# Patient Record
Sex: Male | Born: 1990 | Race: White | Hispanic: No | Marital: Single | Smoking: Current every day smoker
Health system: Southern US, Community
[De-identification: ages and names within clinical notes are randomized; demographics above are authoritative.]

---

## 2015-07-17 ENCOUNTER — Encounter (HOSPITAL_COMMUNITY): Payer: Self-pay

## 2015-07-17 ENCOUNTER — Emergency Department (HOSPITAL_COMMUNITY): Payer: Self-pay

## 2015-07-17 ENCOUNTER — Emergency Department (HOSPITAL_COMMUNITY)
Admission: EM | Admit: 2015-07-17 | Discharge: 2015-07-17 | Disposition: A | Discharge status: Home / Self Care | Payer: Self-pay | Attending: Emergency Medicine | Admitting: Emergency Medicine

## 2015-07-17 DIAGNOSIS — Y998 Other external cause status: Secondary | ICD-10-CM | POA: Insufficient documentation

## 2015-07-17 DIAGNOSIS — Y9289 Other specified places as the place of occurrence of the external cause: Secondary | ICD-10-CM | POA: Insufficient documentation

## 2015-07-17 DIAGNOSIS — F17210 Nicotine dependence, cigarettes, uncomplicated: Secondary | ICD-10-CM | POA: Insufficient documentation

## 2015-07-17 DIAGNOSIS — S62522B Displaced fracture of distal phalanx of left thumb, initial encounter for open fracture: Secondary | ICD-10-CM | POA: Insufficient documentation

## 2015-07-17 DIAGNOSIS — Z880 Allergy status to penicillin: Secondary | ICD-10-CM | POA: Insufficient documentation

## 2015-07-17 DIAGNOSIS — W228XXA Striking against or struck by other objects, initial encounter: Secondary | ICD-10-CM | POA: Insufficient documentation

## 2015-07-17 DIAGNOSIS — Y9389 Activity, other specified: Secondary | ICD-10-CM | POA: Insufficient documentation

## 2015-07-17 DIAGNOSIS — S62502B Fracture of unspecified phalanx of left thumb, initial encounter for open fracture: Secondary | ICD-10-CM

## 2015-07-17 MED ORDER — CLINDAMYCIN PHOSPHATE 900 MG/50ML IV SOLN
900.0000 mg | Freq: Once | INTRAVENOUS | Status: AC
  Administered 2015-07-17: 900 mg via INTRAVENOUS
  Filled 2015-07-17: qty 50

## 2015-07-17 MED ORDER — LIDOCAINE HCL (PF) 1 % IJ SOLN
30.0000 mL | Freq: Once | INTRAMUSCULAR | Status: DC
  Filled 2015-07-17: qty 30

## 2015-07-17 MED ORDER — CEPHALEXIN 250 MG PO CAPS: 250.0000 mg | ORAL_CAPSULE | Freq: Four times a day (QID) | ORAL | Status: DC

## 2015-07-17 MED ORDER — OXYCODONE-ACETAMINOPHEN 5-325 MG PO TABS: 2.0000 | ORAL_TABLET | ORAL | Status: DC | PRN

## 2015-07-17 MED ORDER — BUPIVACAINE HCL (PF) 0.5 % IJ SOLN
20.0000 mL | Freq: Once | INTRAMUSCULAR | Status: DC
  Filled 2015-07-17: qty 20

## 2015-07-17 MED ORDER — FENTANYL CITRATE (PF) 100 MCG/2ML IJ SOLN
100.0000 ug | Freq: Once | INTRAMUSCULAR | Status: AC
  Administered 2015-07-17: 100 ug via INTRAVENOUS
  Filled 2015-07-17: qty 2

## 2015-07-17 MED ORDER — ONDANSETRON HCL 4 MG/2ML IJ SOLN
4.0000 mg | Freq: Once | INTRAMUSCULAR | Status: AC
  Administered 2015-07-17: 4 mg via INTRAVENOUS
  Filled 2015-07-17: qty 2

## 2015-07-17 NOTE — ED Notes (Signed)
Pt has crush injury to left thumb from working on a truck.  Pt was seen at [facility] and was given abx, pain medication and a referral to ortho.  Family reports that pt was unable to follow up with ortho due to lack of insurance.

## 2015-07-17 NOTE — Consult Note (Signed)
Reason for Consult: Crush injury to the left thumb Referring Physician: Anitra Lauth M.D.  Mike Pierce is an 25 y.o. male.  HPI: The patient is a 25 year old male who presents to [facility] emergency room for evaluation of his left thumb. He sustained a crush injury this past Sunday while using a sledgehammer trying to break free a universal joint. The patient is a Curator. Unfortunately, he sustained a significant injury to the left thumb and was seen and evaluated initially at [facility] emergency room. As noted to have an open fracture about the tip apparently the distal tip was in poor repair he underwent a preliminary I and D and closure and was told to follow-up with orthopedics sometime this week. He contacted [facility] emergency room once again stating an increased amount of pain and they recommended that he proceed to have this hospital for evaluation by hand surgery. The patient states he did not want to go to [facility] and thus he presents to [facility] emergency room today. We have discussed all issues with he and his mother who accompanies him today. Planes of significant amount of pain about the distal tip of the left thumb. He has been taking Keflex up into this point in time as well as oxycodone for pain. He denies fevers, chills, nausea, vomiting. He does have a penicillin allergy but has tolerated the Keflex without any difficulties. He denies any other injury.  History reviewed. No pertinent past medical history.  History reviewed. No pertinent past surgical history.  History reviewed. No pertinent family history.  Social History:  reports that he has been smoking Cigarettes.  He has been smoking about 1.00 pack per day. He does not have any smokeless tobacco history on file. He reports that he drinks about 25.2 oz of alcohol per week. He reports that he does not use illicit drugs.  Allergies:  Allergies  Allergen Reactions  . Penicillins Anaphylaxis  . Adult nurse   . Claritin  [Loratadine]   . Sulfa Antibiotics Hives    Medications: I have reviewed the patient's current medications.  No results found for this or any previous visit (from the past 48 hour(s)).  Dg Finger Thumb Left  07/17/2015  CLINICAL DATA:  Hit left thumb with sledgehammer 2 days ago. Worsening pain. EXAM: LEFT THUMB 2+V COMPARISON:  None. FINDINGS: Comminuted fracture noted through the distal aspect of the left thumb distal phalanx. Fracture fragments are significantly displaced and angulated. No visible extension into the IP joint. IMPRESSION: Comminuted, angulated fracture through the tip of the left thumb distal phalanx. Electronically Signed   By: Charlett Nose M.D.   On: 07/17/2015 14:18    Review of Systems  Constitutional: Negative.   HENT: Negative.   Eyes: Negative.   Respiratory: Negative.   Cardiovascular: Negative.   Gastrointestinal: Negative.   Genitourinary: Negative.   Musculoskeletal:       See history of present illness  Skin: Negative.   Neurological: Negative.   Endo/Heme/Allergies: Negative.    Blood pressure 133/77, pulse 94, temperature 98.1 F (36.7 C), temperature source Oral, resp. rate 18, height 5\' 8"  (1.727 m), weight 95.255 kg (210 lb), SpO2 95 %. Physical Exam  Examination of the left thumb: This is noted to have a significant crush injury with a large portion of an exposed portion of distal phalanx protruding through the nail bed about the ulnar aspect of the eponychial fold/lateral fold. He has marked deformity and significant soft tissue disarray about the distal tip. FPL and  EPL are intact. The patient is very uncomfortable in regards to the distal tip. He has no cellulitic process at this juncture. 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  Assessment/Plan: Left thumb crush injury with open fracture of the distal phalanx  comminuted in nature opposed since Sunday with significant soft tissue disarray and deformity. I have had a lengthy discussion with the patient as well as his mother in regards to his thumb. We have recommended proceeding with irrigation and excisional debridement and treatment of an open fracture. I have expressed to them are significant concerns for the potential of infection given the timeframe that has passed. Thus I discussed with him as a complication such as infection, loss of digit, cold  Intolerances, stiffness, etc. After obtaining verbal consent, the patient underwent: #1 copious irrigation and excisional debridement of an open fracture about the left thumb distal phalanx fracture comminuted #2 additional debridement of bony fragments about the left thumb distal phalanx #3 complex left thumb nailbed repair #4 treatment of a comminuted complex open fracture about the left thumb distal phalanx Procedure in detail:  Verbal consent was obtained. Risk and benefits were discussed at length with the patient. The hand was preliminarily prepped and a digital block utilizing 5 mL of Xylocaine without epinephrine as well as 5 mL of Sensorcaine without epinephrine was administered for digital block as well as the dorsal aspect of the thumb. He tolerated this very well and there was no complications he had excellent block noted. Following this he underwent copious irrigation of over 3 L of saline. Tourniquet was then applied to the thumb and the distal portion was cleaned gently. He was noted to have exposed distal phalanx and through the nailbed and associated complex nailbed laceration as well as laceration to the lateral folds both radial and ulnar in nature. The exposed portion of the distal phalanx was then meticulously dissected in order to leave the middle bed intact. It was removed without difficulties with the nailbed remaining. The wound were then copiously irrigated once again and excisional  debridement of bony fragments and the skin, subcutaneous tissue was performed. The wound was quite contaminated. Once the ocean of exposed bone was removed and the bony debris was removed, once again copious irrigation of 2 L of saline was directly placed into the depths of the wound. He tolerated this well and there were no complications. This the distal portion of the phalanx was sculpted to a smooth edge. Surrounding nailbed that was then sharply excised as it was very jagged in nature. The nailbed was repaired utilizing a series of 5.0 chromics in a simple fashion., The lateral folds were repaired utilizing 5.0, the tourniquet was then released excellent refill noted. Following this Adaptic was placed underneath the eponychial fold followed by additional Adaptic and Xeroform. Soft dressings as well as a thumb splint was applied. The patient tolerated the procedure well and there was no complications. We have discussed with him at length the need to be diligent when taking his antibiotics given at concerns for potential of infection. In addition he will take his pain medication accordingly. We have discussed with him the merits of tobacco cessation and have asked him not to drink alcohol when taking his pain medication. Recommended diligent elevation, edema control and range of motion to the remaining digits. He will need to follow-up  with [place] in our office in 10-12 days which juncture we will gently remove his dressings. Radiographs will be obtained and he'll be placed back into a thumb protector. We have discussed with him it will take approximately 2 months use the thumb in a meaningful fashion as he is a Curator. It'll take approximately 4-6 months for the nail plate to regrow. He will contact our office for any questions or concerns. He's been given an additional prescription for Keflex as he tolerates this well as well as oxycodone or pain. We've discussed with him vitamin C thousand milligrams as well as  an over-the-counter stool softener when taking pain medications. Josuha Fontanez L 07/17/2015, 5:15 PM

## 2015-07-17 NOTE — ED Provider Notes (Signed)
CSN: [id number]     Arrival date & time 07/17/15  4098 History   First MD Initiated Contact with Patient 07/17/15 [id number]     Chief Complaint  Patient presents with  . Extremity Pain     (Consider location/radiation/quality/duration/timing/severity/associated sxs/prior Treatment) HPI Comments: Patient who is right-handed presents with crush injury to the left thumb sustained 48 hours ago when he hit it with a sledgehammer. He was diagnosed with open comminuted fx of left thumb. Patient was seen at [facility] where he received stitches, had this thumbnail removed, started on pain medication and antibiotics. He was asked to follow-up with hand physician at [facility], however patient is unable to go to [place]. Tetanus up-to-date. Patient states that he is taking cephalexin as prescribed. Denies other injury. Last ate last night, pain medication 7am, and had 1 Dr. Reino Kent 20 minutes prior to arrival. Wound cleaned well several times prior to arrival.   Patient is a 25 y.o. male presenting with extremity pain. The history is provided by the patient.  Extremity Pain Associated symptoms include arthralgias and myalgias. Pertinent negatives include no joint swelling, neck pain, numbness or weakness.    History reviewed. No pertinent past medical history. History reviewed. No pertinent past surgical history. History reviewed. No pertinent family history. Social History  Substance Use Topics  . Smoking status: Current Every Day Smoker -- 1.00 packs/day    Types: Cigarettes  . Smokeless tobacco: None  . Alcohol Use: 25.2 oz/week    42 Cans of beer per week    Review of Systems  Constitutional: Negative for activity change.  Musculoskeletal: Positive for myalgias and arthralgias. Negative for back pain, joint swelling, gait problem and neck pain.  Skin: Positive for wound.  Neurological: Negative for weakness and numbness.      Allergies  Penicillins; Apricot flavor; Claritin; and  Sulfa antibiotics  Home Medications   Prior to Admission medications   Not on File   BP 153/83 mmHg  Pulse 107  Temp(Src) 98.1 F (36.7 C) (Oral)  Resp 18  Ht 5\' 8"  (1.727 m)  Wt 95.255 kg  BMI 31.94 kg/m2  SpO2 97%   Physical Exam  Constitutional: He appears well-developed and well-nourished.  HENT:  Head: Normocephalic and atraumatic.  Eyes: Conjunctivae are normal.  Neck: Normal range of motion. Neck supple.  Cardiovascular: Normal pulses.   Musculoskeletal: He exhibits edema and tenderness.  Patient with open wound to the dorsal aspect of the left thumb. Several stitches are in place. There is active oozing of blood. There is a deformity noted at the nail bed. No exposed bone visualized.  Neurological: He is alert. No sensory deficit.  Motor, sensation, and vascular distal to the injury is fully intact.   Skin: Skin is warm and dry.  Psychiatric: He has a normal mood and affect.  Nursing note and vitals reviewed.            ED Course  Procedures (including critical care time) Labs Review Labs Reviewed - No data to display  Imaging Review Dg Finger Thumb Left  07/17/2015  CLINICAL DATA:  Hit left thumb with sledgehammer 2 days ago. Worsening pain. EXAM: LEFT THUMB 2+V COMPARISON:  None. FINDINGS: Comminuted fracture noted through the distal aspect of the left thumb distal phalanx. Fracture fragments are significantly displaced and angulated. No visible extension into the IP joint. IMPRESSION: Comminuted, angulated fracture through the tip of the left thumb distal phalanx. Electronically Signed   By: Charlett Nose M.D.  On: 07/17/2015 14:18   I have personally reviewed and evaluated these images and lab results as part of my medical decision-making.   EKG Interpretation None       9:49 AM Patient seen and examined. Work-up initiated. Medications ordered.   Vital signs reviewed and are as follows: BP 153/83 mmHg  Pulse 107  Temp(Src) 98.1 F (36.7 C)  (Oral)  Resp 18  Ht 5\' 8"  (1.727 m)  Wt 95.255 kg  BMI 31.94 kg/m2  SpO2 97%  Spoke with Dr. Amanda PeaGramig who is in OR for the afternoon. Arlys JohnBrian PA-C to see.   4:06 PM Patient has been evaluated by hand. Repair underway at bedside by Karie ChimeraBrian Buchanan PA-C.   Dr. Anitra LauthPlunkett aware of patient. Anticipate d/c to home when completed. He has Percocet and Keflex at home.   MDM   Final diagnoses:  Open fracture of thumb, left, initial encounter   Hand has seen and performed wound revision in ED.    Renne CriglerJoshua Sherida Dobkins, PA-C 07/17/15 1616  Pricilla LovelessScott Goldston, MD 07/20/15 [phone]

## 2015-07-17 NOTE — Progress Notes (Signed)
Orthopedic Tech Progress Note Patient Details:  Mike Pierce 11/21/1990 [id number]  Ortho Devices Type of Ortho Device: Finger splint Ortho Device/Splint Location: lue Ortho Device/Splint Interventions: Application As ordered by Pa Rubye BeachBrian  Laurice Iglesia 07/17/2015, 4:30 PM

## 2015-07-17 NOTE — Discharge Instructions (Signed)
Please read and follow all provided instructions.  Your diagnoses today include:  1. Open fracture of thumb, left, initial encounter     Tests performed today include:  An x-ray of the affected area - shows multiple broken bone in thumb  Vital signs. See below for your results today.   Home care instructions:   Follow any educational materials contained in this packet  Follow R.I.C.E. Protocol:  R - rest your injury   I  - use ice on injury without applying directly to skin  C - compress injury with bandage or splint  E - elevate the injury as much as possible  Follow-up instructions: Please follow-up with Dr. Amanda Pea as instructed.    Return instructions:   Please return if your fingers are numb or tingling, appear gray or blue, or you have severe pain (also elevate the arm and loosen splint or wrap if you were given one)  Please return to the Emergency Department if you experience worsening symptoms.   Please return if you have any other emergent concerns.  Additional Information:  Your vital signs today were: BP 133/77 mmHg   Pulse 94   Temp(Src) 98.1 F (36.7 C) (Oral)   Resp 18   Ht 5\' 8"  (1.727 m)   Wt 95.255 kg   BMI 31.94 kg/m2   SpO2 95% If your blood pressure (BP) was elevated above 135/85 this visit, please have this repeated by your doctor within one month. --------------    Finger Fracture Fractures of fingers are breaks in the bones of the fingers. There are many types of fractures. There are different ways of treating these fractures. Your health care provider will discuss the best way to treat your fracture. CAUSES Traumatic injury is the main cause of broken fingers. These include:  Injuries while playing sports.  Workplace injuries.  Falls. RISK FACTORS Activities that can increase your risk of finger fractures include:  Sports.  Workplace activities that involve machinery.  A condition called osteoporosis, which can make your bones  less dense and cause them to fracture more easily. SIGNS AND SYMPTOMS The main symptoms of a broken finger are pain and swelling within 15 minutes after the injury. Other symptoms include:  Bruising of your finger.  Stiffness of your finger.  Numbness of your finger.  Exposed bones (compound fracture) if the fracture is severe. DIAGNOSIS  The best way to diagnose a broken bone is with X-ray imaging. Additionally, your health care provider will use this X-ray image to evaluate the position of the broken finger bones.  TREATMENT  Finger fractures can be treated with:   Nonreduction--This means the bones are in place. The finger is splinted without changing the positions of the bone pieces. The splint is usually left on for about a week to 10 days. This will depend on your fracture and what your health care provider thinks.  Closed reduction--The bones are put back into position without using surgery. The finger is then splinted.  Open reduction and internal fixation--The fracture site is opened. Then the bone pieces are fixed into place with pins or some type of hardware. This is seldom required. It depends on the severity of the fracture. HOME CARE INSTRUCTIONS   Follow your health care provider's instructions regarding activities, exercises, and physical therapy.  Only take over-the-counter or prescription medicines for pain, discomfort, or fever as directed by your health care provider. SEEK MEDICAL CARE IF: You have pain or swelling that limits the motion or use  of your fingers. SEEK IMMEDIATE MEDICAL CARE IF:  Your finger becomes numb. MAKE SURE YOU:   Understand these instructions.  Will watch your condition.  Will get help right away if you are not doing well or get worse.   This information is not intended to replace advice given to you by your health care provider. Make sure you discuss any questions you have with your health care provider.   Document Released:  10/12/2000 Document Revised: 04/20/2013 Document Reviewed: 02/09/2013 Elsevier Interactive Patient Education [employer]2016 [employer] Inc[employer]

## 2017-06-30 IMAGING — DX DG FINGER THUMB 2+V*L*
3 series · 3 of 3 positions shown · non-contrast
Comparison: None.

CLINICAL DATA: Hit left thumb with sledgehammer 2 days ago.
Worsening pain.

EXAM:
LEFT THUMB 2+V

[finger ap]
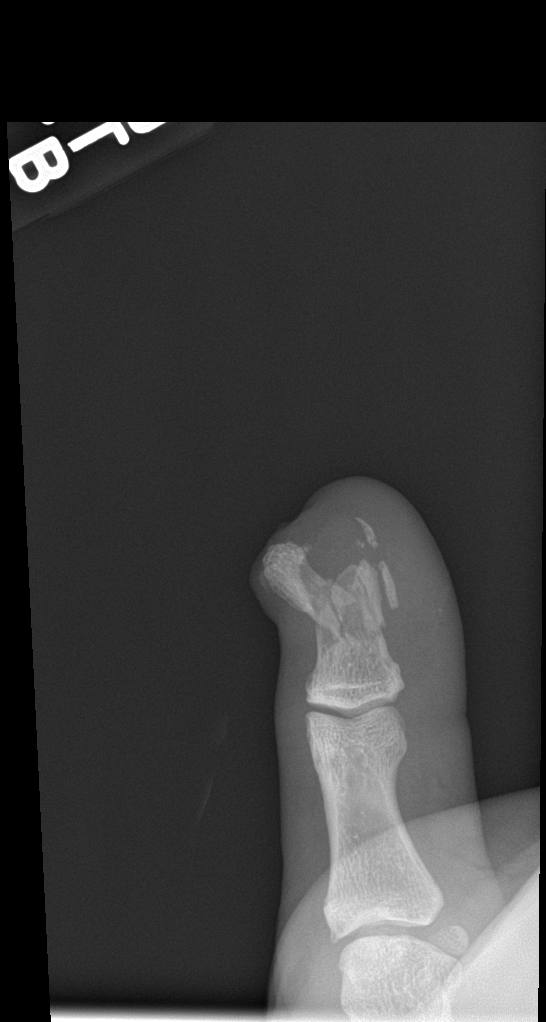

[finger obl]
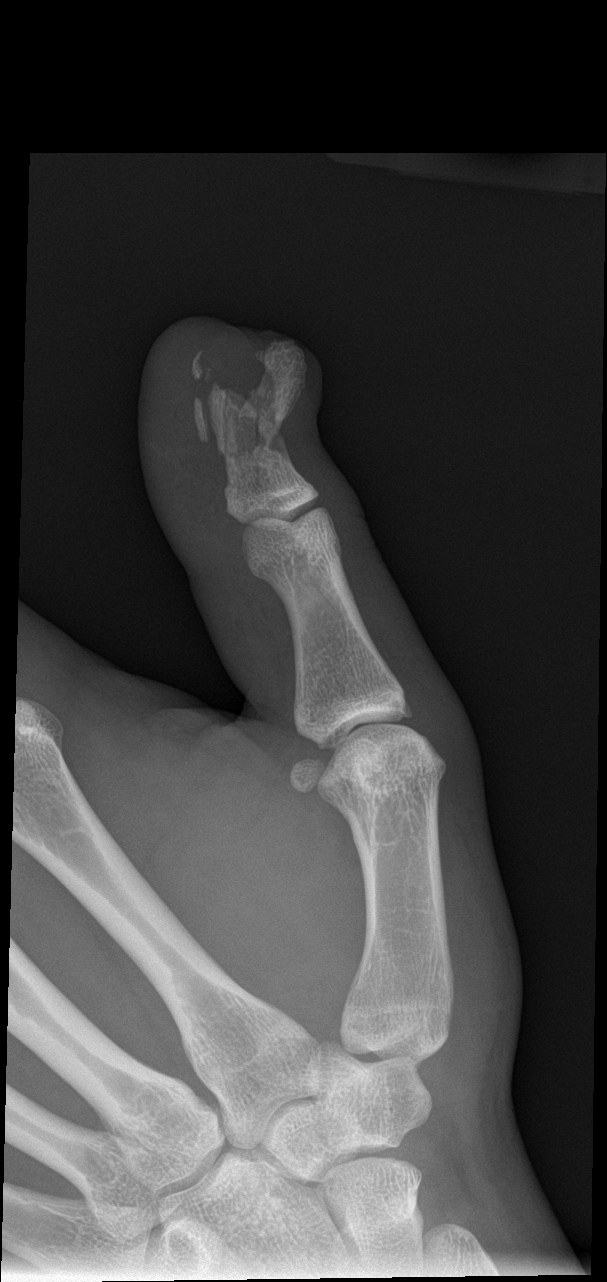

[finger lat]
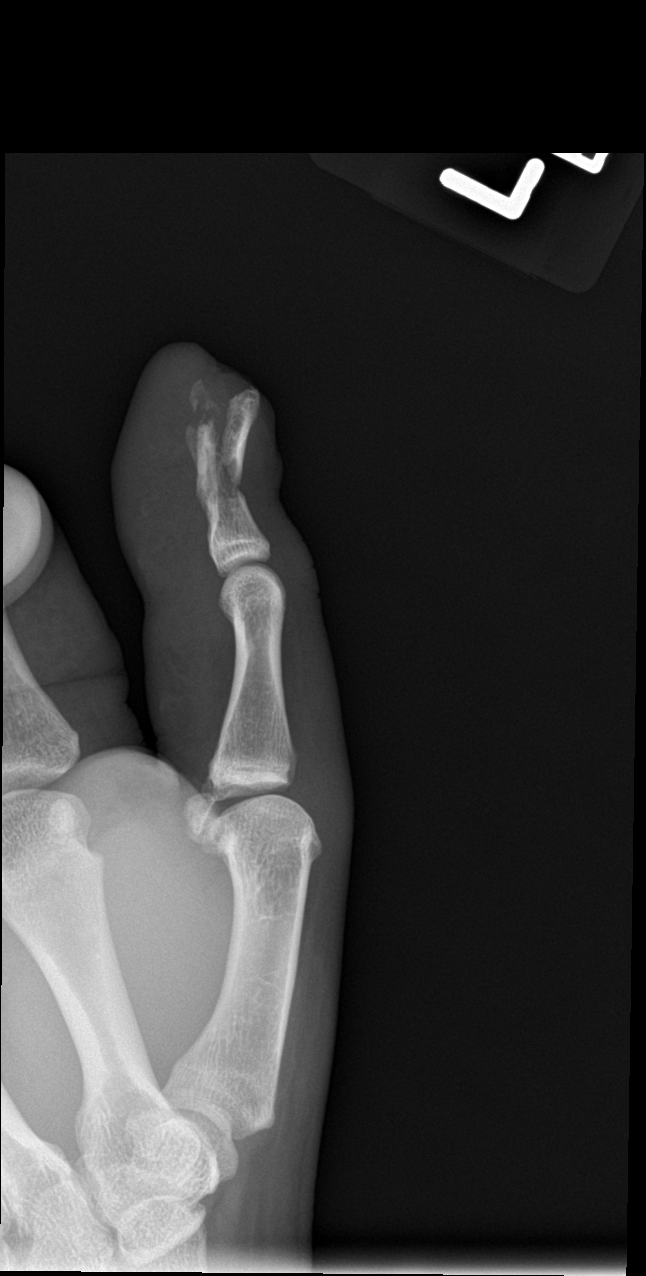

[3 of 3 positions shown; findings below may reference images not displayed]

FINDINGS: Comminuted fracture noted through the distal aspect of the left
thumb distal phalanx. Fracture fragments are significantly displaced
and angulated. No visible extension into the IP joint.
IMPRESSION: Comminuted, angulated fracture through the tip of the left thumb
distal phalanx.

## 2022-03-07 ENCOUNTER — Ambulatory Visit
Admission: EM | Admit: 2022-03-07 | Discharge: 2022-03-07 | Disposition: A | Discharge status: Home / Self Care | Payer: BC Managed Care – PPO | Attending: Physician Assistant | Admitting: Physician Assistant

## 2022-03-07 DIAGNOSIS — R000 Tachycardia, unspecified: Secondary | ICD-10-CM

## 2022-03-07 DIAGNOSIS — L089 Local infection of the skin and subcutaneous tissue, unspecified: Secondary | ICD-10-CM | POA: Diagnosis not present

## 2022-03-07 DIAGNOSIS — R21 Rash and other nonspecific skin eruption: Secondary | ICD-10-CM | POA: Diagnosis not present

## 2022-03-07 LAB — POCT URINALYSIS DIP (MANUAL ENTRY)
Bilirubin, UA: NEGATIVE
Blood, UA: NEGATIVE
Glucose, UA: NEGATIVE mg/dL
Ketones, POC UA: NEGATIVE mg/dL
Nitrite, UA: NEGATIVE
Protein Ur, POC: NEGATIVE mg/dL
Spec Grav, UA: 1.02 (ref 1.010–1.025)
Urobilinogen, UA: 0.2 E.U./dL
pH, UA: 7 (ref 5.0–8.0)

## 2022-03-07 MED ORDER — DOXYCYCLINE HYCLATE 100 MG PO CAPS: 100.0000 mg | ORAL_CAPSULE | Freq: Two times a day (BID) | ORAL | 0 refills | Status: DC

## 2022-03-07 NOTE — Discharge Instructions (Addendum)
Believe that your rash is allergic in nature.  Please continue Benadryl.  Use hypoallergenic soaps and detergents.  I recommend an over-the-counter emollient such as Aquaphor for moisturizing.  We are going to cover for secondary bacterial infection.  Take doxycycline 100 mg twice daily for 10 days.  Make sure to stay out of the sun while on this medication as it can cause you to have a very bad sunburn.  If your symptoms or not improving we need to reevaluate you.  If anything worsens return immediately.  Your heart rate remains slightly elevated.  It is important that you avoid caffeine and decongestants.  We will contact you with your lab work as soon as we have these results.  I do recommend that if you continue to have a high heart rate (above 100) you follow-up with cardiology.  To call to schedule an appointment.  Someone should reach out to you to schedule an appointment with a primary care provider.  If you develop any chest pain, shortness of breath, heart racing, lightheadedness you need to go to the emergency room.

## 2022-03-07 NOTE — ED Provider Notes (Signed)
[facility]    CSN: [id number] Arrival date & time: 03/07/22  1451      History   Chief Complaint Chief Complaint  Patient presents with   Rash    HPI Mike Pierce is a 31 y.o. male.   Patient presents today with a several month history of pruritic and painful rash on bilateral lower arms.  He denies any exposure to chemicals, plants, insects, animals.  Denies any medication changes.  Reports a few episodes on his legs but generally are localized to forearms.  He has tried over-the-counter eczema cream without improvement.  Denies history of dermatological condition including psoriasis.  Denies any household contacts with similar symptoms or exposure to scabies.  Denies any fever, nausea, vomiting.  Patient was noted to be tachycardic on intake.  He attributes this to stress as he reports that he is smoking more and under a lot of stress.  He denies any decongestant or caffeine use.  He denies any chest pain, palpitations, lightheadedness, dizziness, syncopal episodes.  He denies history of thyroid disorder.  Denies any anemia or blood loss including melena, hematochezia, hematemesis.    History reviewed. No pertinent past medical history.  There are no problems to display for this patient.   History reviewed. No pertinent surgical history.     Home Medications    Prior to Admission medications   Medication Sig Start Date End Date Taking? Authorizing Provider  doxycycline (VIBRAMYCIN) 100 MG capsule Take 1 capsule (100 mg total) by mouth 2 (two) times daily. 03/07/22  Yes Marshawn Ninneman, Noberto Retort, PA-C    Family History History reviewed. No pertinent family history.  Social History Social History   Tobacco Use   Smoking status: Every Day    Packs/day: 1.00    Types: Cigarettes  Substance Use Topics   Alcohol use: Yes    Alcohol/week: 42.0 standard drinks of alcohol    Types: 42 Cans of beer per week   Drug use: No     Allergies   Penicillins, Apricot  flavor, Claritin [loratadine], and Sulfa antibiotics   Review of Systems Review of Systems  Constitutional:  Positive for activity change. Negative for appetite change, fatigue and fever.  Respiratory:  Negative for cough and shortness of breath.   Cardiovascular:  Negative for chest pain, palpitations and leg swelling.  Gastrointestinal:  Negative for abdominal pain, diarrhea, nausea and vomiting.  Skin:  Positive for rash.  Neurological:  Negative for dizziness, light-headedness and headaches.     Physical Exam Triage Vital Signs ED Triage Vitals [03/07/22 1501]  Enc Vitals Group     BP (!) 124/98     Pulse Rate (!) 113     Resp 16     Temp 98.5 F (36.9 C)     Temp Source Oral     SpO2 95 %     Weight      Height      Head Circumference      Peak Flow      Pain Score 0     Pain Loc      Pain Edu?      Excl. in GC?    No data found.  Updated Vital Signs BP (!) 124/98 (BP Location: Left Arm)   Pulse (!) 115   Temp 98.5 F (36.9 C) (Oral)   Resp 16   SpO2 95%   Visual Acuity Right Eye Distance:   Left Eye Distance:   Bilateral Distance:    Right  Eye Near:   Left Eye Near:    Bilateral Near:     Physical Exam Vitals reviewed.  Constitutional:      General: He is awake.     Appearance: Normal appearance. He is well-developed. He is not ill-appearing.     Comments: Very pleasant male appears stated age in no acute distress sitting comfortably in exam room  HENT:     Head: Normocephalic and atraumatic.     Mouth/Throat:     Pharynx: Uvula midline. No oropharyngeal exudate or posterior oropharyngeal erythema.  Cardiovascular:     Rate and Rhythm: Regular rhythm. Tachycardia present.     Heart sounds: Normal heart sounds, S1 normal and S2 normal. No murmur heard. Pulmonary:     Effort: Pulmonary effort is normal.     Breath sounds: No stridor. Wheezing present. No rhonchi or rales.     Comments: Scattered wheezing Skin:    Findings: Rash present. Rash  is macular and papular.     Comments: Maculopapular rash noted bilateral forearms and into upper arm/axilla.  No pustules noted.  Serous drainage noted from some lesions.  Significant excoriation present.  Neurological:     Mental Status: He is alert.  Psychiatric:        Behavior: Behavior is cooperative.      UC Treatments / Results  Labs (all labs ordered are listed, but only abnormal results are displayed) Labs Reviewed  CBC WITH DIFFERENTIAL/PLATELET  COMPREHENSIVE METABOLIC PANEL  TSH  T4, FREE  POCT URINALYSIS DIP (MANUAL ENTRY)    EKG   Radiology No results found.  Procedures Procedures (including critical care time)  Medications Ordered in UC Medications - No data to display  Initial Impression / Assessment and Plan / UC Course  I have reviewed the triage vital signs and the nursing notes.  Pertinent labs & imaging results that were available during my care of the patient were reviewed by me and considered in my medical decision making (see chart for details).     Unclear etiology of rash.  Recommended that he use Benadryl on a regular basis to help with pruritus.  He is to use hypoallergenic soaps and detergents and emollient such as Aquaphor.  Concern for secondary infection given clinical presentation today.  Given patient's medication allergies we will start doxycycline 100 mg twice daily for 10 days.  Discussed that he should avoid prolonged sun exposure while on this medication due to photosensitivity associated with this medicine.  Discussed that if he has any spread of rash or additional symptoms he needs to be seen immediately.   Patient was noted to be tachycardic.  He attributed this to anxiety.  EKG was obtained that showed sinus tachycardia with ventricular rate 105 bpm without ischemic changes; no previous to compare.  UA showed no evidence of dehydration.  CBC, CMP, thyroid obtained today-results pending.  Discussed that he should avoid caffeine,  decongestants and make sure he is drinking plenty of fluid.  He does not have a primary care provider so we will try to establish him with someone via PCP assistance.  Discussed that if he continues to have a high heart rate he should follow-up with cardiology and was given contact information for local provider.  If he has any palpitations, lightheadedness, chest pain, shortness of breath he needs to go to the emergency room immediately to which he expressed understanding.  Final Clinical Impressions(s) / UC Diagnoses   Final diagnoses:  Rash and nonspecific skin eruption  Skin infection  Tachycardia     Discharge Instructions      Believe that your rash is allergic in nature.  Please continue Benadryl.  Use hypoallergenic soaps and detergents.  I recommend an over-the-counter emollient such as Aquaphor for moisturizing.  We are going to cover for secondary bacterial infection.  Take doxycycline 100 mg twice daily for 10 days.  Make sure to stay out of the sun while on this medication as it can cause you to have a very bad sunburn.  If your symptoms or not improving we need to reevaluate you.  If anything worsens return immediately.  Your heart rate remains slightly elevated.  It is important that you avoid caffeine and decongestants.  We will contact you with your lab work as soon as we have these results.  I do recommend that if you continue to have a high heart rate (above 100) you follow-up with cardiology.  To call to schedule an appointment.  Someone should reach out to you to schedule an appointment with a primary care provider.  If you develop any chest pain, shortness of breath, heart racing, lightheadedness you need to go to the emergency room.     ED Prescriptions     Medication Sig Dispense Auth. Provider   doxycycline (VIBRAMYCIN) 100 MG capsule Take 1 capsule (100 mg total) by mouth 2 (two) times daily. 20 capsule Gladyce Mcray, Noberto Retort, PA-C      PDMP not reviewed this  encounter.   Jeani Hawking, PA-C 03/07/22 1555

## 2022-03-07 NOTE — ED Triage Notes (Signed)
Pt c/o rash to bilateral arms, ears, chest, underarms.   States they burn and itch though denies pain. States he pops them like white-head pimples. Has been ongoing x several months but recently received health insurance.

## 2022-03-08 LAB — CBC WITH DIFFERENTIAL/PLATELET
Basophils Absolute: 0.1 10*3/uL (ref 0.0–0.2)
Basos: 1 %
EOS (ABSOLUTE): 0 10*3/uL (ref 0.0–0.4)
Eos: 0 %
Hematocrit: 56.1 % — ABNORMAL HIGH (ref 37.5–51.0)
Hemoglobin: 19.3 g/dL — ABNORMAL HIGH (ref 13.0–17.7)
Immature Grans (Abs): 0.1 10*3/uL (ref 0.0–0.1)
Immature Granulocytes: 1 %
Lymphocytes Absolute: 2.1 10*3/uL (ref 0.7–3.1)
Lymphs: 17 %
MCH: 31.1 pg (ref 26.6–33.0)
MCHC: 34.4 g/dL (ref 31.5–35.7)
MCV: 90 fL (ref 79–97)
Monocytes Absolute: 0.9 10*3/uL (ref 0.1–0.9)
Monocytes: 7 %
Neutrophils Absolute: 9.3 10*3/uL — ABNORMAL HIGH (ref 1.4–7.0)
Neutrophils: 74 %
Platelets: 222 10*3/uL (ref 150–450)
RBC: 6.21 x10E6/uL — ABNORMAL HIGH (ref 4.14–5.80)
RDW: 12.7 % (ref 11.6–15.4)
WBC: 12.5 10*3/uL — ABNORMAL HIGH (ref 3.4–10.8)

## 2022-03-08 LAB — COMPREHENSIVE METABOLIC PANEL
ALT: 24 IU/L (ref 0–44)
AST: 23 IU/L (ref 0–40)
Albumin/Globulin Ratio: 1.8 (ref 1.2–2.2)
Albumin: 4.6 g/dL (ref 4.1–5.1)
Alkaline Phosphatase: 99 IU/L (ref 44–121)
BUN/Creatinine Ratio: 9 (ref 9–20)
BUN: 10 mg/dL (ref 6–20)
Bilirubin Total: 0.5 mg/dL (ref 0.0–1.2)
CO2: 21 mmol/L (ref 20–29)
Calcium: 9.4 mg/dL (ref 8.7–10.2)
Chloride: 100 mmol/L (ref 96–106)
Creatinine, Ser: 1.15 mg/dL (ref 0.76–1.27)
Globulin, Total: 2.6 g/dL (ref 1.5–4.5)
Glucose: 133 mg/dL — ABNORMAL HIGH (ref 70–99)
Potassium: 4.3 mmol/L (ref 3.5–5.2)
Sodium: 141 mmol/L (ref 134–144)
Total Protein: 7.2 g/dL (ref 6.0–8.5)
eGFR: 87 mL/min/{1.73_m2} (ref >59)

## 2022-03-08 LAB — TSH: TSH: 2.75 u[IU]/mL (ref 0.450–4.500)

## 2022-03-08 LAB — T4, FREE: Free T4: 1.41 ng/dL (ref 0.82–1.77)

## 2022-03-14 ENCOUNTER — Encounter: Payer: Self-pay | Admitting: Emergency Medicine

## 2022-11-02 ENCOUNTER — Ambulatory Visit
Admission: EM | Admit: 2022-11-02 | Discharge: 2022-11-02 | Disposition: A | Discharge status: Home / Self Care | Payer: PRIVATE HEALTH INSURANCE | Attending: Internal Medicine | Admitting: Internal Medicine

## 2022-11-02 DIAGNOSIS — H00012 Hordeolum externum right lower eyelid: Secondary | ICD-10-CM | POA: Diagnosis not present

## 2022-11-02 DIAGNOSIS — R062 Wheezing: Secondary | ICD-10-CM

## 2022-11-02 MED ORDER — PREDNISONE 20 MG PO TABS: 40.0000 mg | ORAL_TABLET | Freq: Every day | ORAL | 0 refills | Status: AC

## 2022-11-02 MED ORDER — ALBUTEROL SULFATE (2.5 MG/3ML) 0.083% IN NEBU
2.5000 mg | INHALATION_SOLUTION | Freq: Once | RESPIRATORY_TRACT | Status: AC
  Administered 2022-11-02: 2.5 mg via RESPIRATORY_TRACT

## 2022-11-02 MED ORDER — ALBUTEROL SULFATE HFA 108 (90 BASE) MCG/ACT IN AERS: 1.0000 | INHALATION_SPRAY | Freq: Four times a day (QID) | RESPIRATORY_TRACT | 0 refills | Status: AC | PRN

## 2022-11-02 MED ORDER — CEPHALEXIN 500 MG PO CAPS: 500.0000 mg | ORAL_CAPSULE | Freq: Four times a day (QID) | ORAL | 0 refills | Status: AC

## 2022-11-02 NOTE — ED Triage Notes (Signed)
Wheezing heard during triage. Pt states history of inhaler use years ago. Reports is a current smoker. Denies dyspnea.

## 2022-11-02 NOTE — ED Triage Notes (Signed)
Pt c/o stye to right eye onset ~ 1 month ago not relieved with erythromycin opth oint.

## 2022-11-02 NOTE — Discharge Instructions (Signed)
I have prescribed an oral antibiotic to take for stye. Continue warm compresses.  Follow-up with eye doctor at provided contact information.  I have prescribed albuterol inhaler as well as prednisone for your wheezing.  Follow-up with family medicine for this.  Given your heart rate, please follow-up with cardiologist at provided contact information as well.

## 2022-11-02 NOTE — ED Provider Notes (Signed)
[facility]    CSN: [id number] Arrival date & time: 11/02/22  0907      History   Chief Complaint Chief Complaint  Patient presents with   Stye    HPI Ellwood Steidle is a 32 y.o. male.   Patient presents with swelling present to right lower eyelid that has been present for approximately 1 month.  He states that he has a saw a different urgent care a week ago who prescribed him erythromycin ointment which has not provided any improvement.  Denies any drainage from the area.  Denies any vision changes.  Patient also has audible wheezing.  He reports that this is normal for him and he always has this and is attributing this to smoking.  Reports history of asthma "a while back".  He does not use any type of inhaler.  Denies that he is having any shortness of breath, new onset cough, fever.  Patient's heart rate is also elevated.  He reports that he was told this several months ago as well and was referred to cardiology.  Although, he states that he travels a lot for work and has not been in to see the cardiologist.  Denies chest pain, shortness of breath, palpitations.     History reviewed. No pertinent past medical history.  There are no problems to display for this patient.   History reviewed. No pertinent surgical history.     Home Medications    Prior to Admission medications   Medication Sig Start Date End Date Taking? Authorizing Provider  albuterol (VENTOLIN HFA) 108 (90 Base) MCG/ACT inhaler Inhale 1-2 puffs into the lungs every 6 (six) hours as needed for wheezing or shortness of breath. 11/02/22  Yes Carson Bogden, [place] E, FNP  cephALEXin (KEFLEX) 500 MG capsule Take 1 capsule (500 mg total) by mouth 4 (four) times daily for 5 days. 11/02/22 11/07/22 Yes Shellia Hartl, Acie Fredrickson, FNP  predniSONE (DELTASONE) 20 MG tablet Take 2 tablets (40 mg total) by mouth daily for 5 days. 11/02/22 11/07/22 Yes Gustavus Bryant, FNP    Family History History reviewed. No pertinent family  history.  Social History Social History   Tobacco Use   Smoking status: Every Day    Packs/day: 1    Types: Cigarettes  Substance Use Topics   Alcohol use: Yes    Alcohol/week: 42.0 standard drinks of alcohol    Types: 42 Cans of beer per week   Drug use: No     Allergies   Penicillins, Apricot flavor, Claritin [loratadine], and Sulfa antibiotics   Review of Systems Review of Systems Per HPI  Physical Exam Triage Vital Signs ED Triage Vitals  Enc Vitals Group     BP 11/02/22 0945 (!) 143/83     Pulse Rate 11/02/22 0945 (!) 110     Resp 11/02/22 0945 16     Temp 11/02/22 0945 98 F (36.7 C)     Temp Source 11/02/22 0945 Oral     SpO2 11/02/22 0945 95 %     Weight --      Height --      Head Circumference --      Peak Flow --      Pain Score 11/02/22 0946 0     Pain Loc --      Pain Edu? --      Excl. in GC? --    No data found.  Updated Vital Signs BP (!) 143/83 (BP Location: Right Arm)   Pulse Marland Kitchen)  110   Temp 98 F (36.7 C) (Oral)   Resp 16   SpO2 95%   Visual Acuity Right Eye Distance:   Left Eye Distance:   Bilateral Distance:    Right Eye Near:   Left Eye Near:    Bilateral Near:     Physical Exam Constitutional:      General: He is not in acute distress.    Appearance: Normal appearance. He is not toxic-appearing or diaphoretic.  HENT:     Head: Normocephalic and atraumatic.  Eyes:     Extraocular Movements: Extraocular movements intact.     Conjunctiva/sclera: Conjunctivae normal.      Comments: Patient has area of swelling present to right lower eyelid.  No drainage noted.  Swelling is not diffuse and other eyelids appear normal.  Cardiovascular:     Rate and Rhythm: Regular rhythm. Tachycardia present.     Pulses: Normal pulses.     Heart sounds: Normal heart sounds.  Pulmonary:     Effort: Pulmonary effort is normal.     Breath sounds: Wheezing present.     Comments: Wheezing to auscultation bilaterally.  It is also  audible. Neurological:     General: No focal deficit present.     Mental Status: He is alert and oriented to person, place, and time. Mental status is at baseline.  Psychiatric:        Mood and Affect: Mood normal.        Behavior: Behavior normal.        Thought Content: Thought content normal.        Judgment: Judgment normal.      UC Treatments / Results  Labs (all labs ordered are listed, but only abnormal results are displayed) Labs Reviewed - No data to display  EKG   Radiology No results found.  Procedures Procedures (including critical care time)  Medications Ordered in UC Medications  albuterol (PROVENTIL) (2.5 MG/3ML) 0.083% nebulizer solution 2.5 mg (2.5 mg Nebulization Given 11/02/22 1001)    Initial Impression / Assessment and Plan / UC Course  I have reviewed the triage vital signs and the nursing notes.  Pertinent labs & imaging results that were available during my care of the patient were reviewed by me and considered in my medical decision making (see chart for details).     1.  Stye versus Chalazion  Differential diagnoses for eyelid lesion appears to be stye versus chalazion.  Given his symptoms have been refractory to erythromycin, will prescribe cephalexin to see if this will be helpful.  Cephalexin prescribed given penicillin allergy.  He reports he has taken cephalexin previously and tolerated well.  Also advised continuing warm compresses.  I do think patient would benefit from being evaluated by ophthalmology so he was provided with contact information for ophthalmology for follow-up.  2.  Wheezing  Patient had audible wheezing on exam but reports this is baseline for him and is unchanged for a few years.  He is also not tachypneic and does not have any shortness of breath so do not think that emergent evaluation is necessary.  Albuterol nebulizer treatment was administered with minimal improvement in lung sounds.  Although, patient is adamant  that he feels fine.  Therefore, will prescribe albuterol inhaler for patient to take as needed and prednisone steroid to decrease inflammation in chest.  Encouraged patient to follow-up with family medicine as he may need a referral to asthma specialist.  Encouraged strict ER precautions as well.  3.  Tachycardia  It appears that patient's tachycardia was addressed at previous urgent care visit that was unremarkable.  Blood work was also completed that was unremarkable.  Was advised to follow-up with cardiology at that time but never did.  Therefore, patient was again provided with contact information for cardiology and advised to follow-up.  Given patient is asymptomatic and other vital signs are stable, do not think that emergent evaluation is necessary.  It appears the patient's tachycardia has been present for quite some time too which is reassuring for no emergent need.  Patient verbalized understanding and was agreeable with plan. Final Clinical Impressions(s) / UC Diagnoses   Final diagnoses:  Hordeolum externum of right lower eyelid  Wheezing     Discharge Instructions      I have prescribed an oral antibiotic to take for stye. Continue warm compresses.  Follow-up with eye doctor at provided contact information.  I have prescribed albuterol inhaler as well as prednisone for your wheezing.  Follow-up with family medicine for this.  Given your heart rate, please follow-up with cardiologist at provided contact information as well.    ED Prescriptions     Medication Sig Dispense Auth. Provider   cephALEXin (KEFLEX) 500 MG capsule Take 1 capsule (500 mg total) by mouth 4 (four) times daily for 5 days. 20 capsule [place], Rolly Salter E, [place]   albuterol (VENTOLIN HFA) 108 (90 Base) MCG/ACT inhaler Inhale 1-2 puffs into the lungs every 6 (six) hours as needed for wheezing or shortness of breath. 1 each [place], Rolly Salter E, [place]   predniSONE (DELTASONE) 20 MG tablet Take 2 tablets (40 mg total) by  mouth daily for 5 days. 10 tablet Gustavus Bryant, [place]      PDMP not reviewed this encounter.   Gustavus Bryant, [place] 11/02/22 1240

## 2023-06-13 ENCOUNTER — Encounter: Payer: Self-pay | Admitting: Emergency Medicine

## 2023-06-13 ENCOUNTER — Ambulatory Visit
Admission: EM | Admit: 2023-06-13 | Discharge: 2023-06-13 | Disposition: A | Discharge status: Home / Self Care | Payer: PRIVATE HEALTH INSURANCE

## 2023-06-13 DIAGNOSIS — J209 Acute bronchitis, unspecified: Secondary | ICD-10-CM

## 2023-06-13 MED ORDER — AZITHROMYCIN 250 MG PO TABS: 250.0000 mg | ORAL_TABLET | Freq: Every day | ORAL | 0 refills | Status: AC

## 2023-06-13 MED ORDER — PREDNISONE 20 MG PO TABS: 40.0000 mg | ORAL_TABLET | Freq: Every day | ORAL | 0 refills | Status: AC

## 2023-06-13 NOTE — ED Triage Notes (Signed)
Patient c/o non-productive cough, voice hoarseness, some chills x 1 week.  Patient has been taken Mucinex.

## 2023-06-13 NOTE — ED Provider Notes (Signed)
[facility]    CSN: [id number] Arrival date & time: 06/13/23  7829      History   Chief Complaint Chief Complaint  Patient presents with   URI    HPI Mike Pierce is a 32 y.o. male.   Patient here today for evaluation of cough that he has had for the last week.  He reports cough is nonproductive.  He questions possible fever at times.  He reports some hoarseness and chills.  He has tried taking Mucinex without resolution.  He does not report any vomiting or diarrhea.  The history is provided by the patient.    History reviewed. No pertinent past medical history.  There are no problems to display for this patient.   History reviewed. No pertinent surgical history.     Home Medications    Prior to Admission medications   Medication Sig Start Date End Date Taking? Authorizing Provider  albuterol (VENTOLIN HFA) 108 (90 Base) MCG/ACT inhaler Inhale 1-2 puffs into the lungs every 6 (six) hours as needed for wheezing or shortness of breath. 11/02/22  Yes [place], Rolly Salter E, FNP  azithromycin (ZITHROMAX) 250 MG tablet Take 1 tablet (250 mg total) by mouth daily. Take first 2 tablets together, then 1 every day until finished. 06/13/23  Yes Tomi Bamberger, PA-C  budesonide-formoterol (SYMBICORT) 80-4.5 MCG/ACT inhaler Inhale 2 puffs into the lungs. 03/19/23 03/18/24 Yes [provider]  predniSONE (DELTASONE) 20 MG tablet Take 2 tablets (40 mg total) by mouth daily with breakfast for 5 days. 06/13/23 06/18/23 Yes Tomi Bamberger, PA-C    Family History History reviewed. No pertinent family history.  Social History Social History   Tobacco Use   Smoking status: Every Day    Current packs/day: 1.00    Types: Cigarettes  Vaping Use   Vaping status: Never Used  Substance Use Topics   Alcohol use: Yes    Alcohol/week: 42.0 standard drinks of alcohol    Types: 42 Cans of beer per week   Drug use: No     Allergies   Penicillins, Apricot flavor, Claritin  [loratadine], and Sulfa antibiotics   Review of Systems Review of Systems  Constitutional:  Positive for chills. Negative for fever.  HENT:  Positive for congestion. Negative for ear pain and sore throat.   Eyes:  Negative for discharge and redness.  Respiratory:  Positive for cough. Negative for shortness of breath.   Gastrointestinal:  Negative for abdominal pain, nausea and vomiting.     Physical Exam Triage Vital Signs ED Triage Vitals  Encounter Vitals Group     BP      Systolic BP Percentile      Diastolic BP Percentile      Pulse      Resp      Temp      Temp src      SpO2      Weight      Height      Head Circumference      Peak Flow      Pain Score      Pain Loc      Pain Education      Exclude from Growth Chart    No data found.  Updated Vital Signs BP (!) 156/83 (BP Location: Left Arm)   Pulse (!) 110   Temp 98.4 F (36.9 C) (Oral)   Resp 18   Ht 5\' 7"  (1.702 m)   SpO2 94%   BMI 32.89  kg/m   Visual Acuity Right Eye Distance:   Left Eye Distance:   Bilateral Distance:    Right Eye Near:   Left Eye Near:    Bilateral Near:     Physical Exam Vitals and nursing note reviewed.  Constitutional:      General: He is not in acute distress.    Appearance: He is well-developed. He is not ill-appearing.  HENT:     Head: Normocephalic and atraumatic.     Nose: Congestion present.     Mouth/Throat:     Mouth: Mucous membranes are moist.     Pharynx: No oropharyngeal exudate or posterior oropharyngeal erythema.     Tonsils: 0 on the right. 0 on the left.  Eyes:     Conjunctiva/sclera: Conjunctivae normal.  Cardiovascular:     Rate and Rhythm: Normal rate and regular rhythm.     Heart sounds: Normal heart sounds. No murmur heard. Pulmonary:     Effort: Pulmonary effort is normal. No respiratory distress.     Breath sounds: Rhonchi (mild, scattered) present. No wheezing or rales.  Skin:    General: Skin is warm and dry.  Neurological:      Mental Status: He is alert.  Psychiatric:        Mood and Affect: Mood normal.        Behavior: Behavior normal.      UC Treatments / Results  Labs (all labs ordered are listed, but only abnormal results are displayed) Labs Reviewed - No data to display  EKG   Radiology No results found.  Procedures Procedures (including critical care time)  Medications Ordered in UC Medications - No data to display  Initial Impression / Assessment and Plan / UC Course  I have reviewed the triage vital signs and the nursing notes.  Pertinent labs & imaging results that were available during my care of the patient were reviewed by me and considered in my medical decision making (see chart for details).    Low suspicion for community-acquired pneumonia but will treat to cover bronchitis as well as possible atypical pneumonia.  Steroid burst and Z-Pak prescribed.  Recommended follow-up if no gradual improvement with any further concerns.  Final Clinical Impressions(s) / UC Diagnoses   Final diagnoses:  Acute bronchitis, unspecified organism   Discharge Instructions   None    ED Prescriptions     Medication Sig Dispense Auth. Provider   predniSONE (DELTASONE) 20 MG tablet Take 2 tablets (40 mg total) by mouth daily with breakfast for 5 days. 10 tablet Erma Pinto F, PA-C   azithromycin (ZITHROMAX) 250 MG tablet Take 1 tablet (250 mg total) by mouth daily. Take first 2 tablets together, then 1 every day until finished. 6 tablet Tomi Bamberger, PA-C      PDMP not reviewed this encounter.   Tomi Bamberger, PA-C 06/13/23 1201
